# Patient Record
Sex: Female | Born: 1970 | Race: Black or African American | Hispanic: No | Smoking: Never smoker
Health system: Southern US, Community
[De-identification: ages and names within clinical notes are randomized; demographics above are authoritative.]

## PROBLEM LIST (undated history)

## (undated) ENCOUNTER — Encounter

## (undated) ENCOUNTER — Telehealth

## (undated) ENCOUNTER — Inpatient Hospital Stay: Payer: Medicare (Managed Care)

## (undated) ENCOUNTER — Ambulatory Visit

## (undated) ENCOUNTER — Ambulatory Visit: Attending: Internal Medicine | Primary: Internal Medicine

## (undated) ENCOUNTER — Ambulatory Visit: Payer: Medicare (Managed Care)

## (undated) ENCOUNTER — Encounter: Attending: Pharmacist | Primary: Pharmacist

## (undated) ENCOUNTER — Ambulatory Visit: Payer: MEDICARE | Attending: Neurology | Primary: Neurology

## (undated) ENCOUNTER — Ambulatory Visit: Payer: MEDICARE | Attending: Internal Medicine | Primary: Internal Medicine

## (undated) ENCOUNTER — Encounter
Attending: Student in an Organized Health Care Education/Training Program | Primary: Student in an Organized Health Care Education/Training Program

## (undated) ENCOUNTER — Ambulatory Visit: Payer: MEDICARE

## (undated) ENCOUNTER — Ambulatory Visit: Attending: Pharmacist | Primary: Pharmacist

## (undated) ENCOUNTER — Ambulatory Visit
Payer: MEDICARE | Attending: Student in an Organized Health Care Education/Training Program | Primary: Student in an Organized Health Care Education/Training Program

## (undated) ENCOUNTER — Ambulatory Visit: Attending: Neurology | Primary: Neurology

## (undated) ENCOUNTER — Ambulatory Visit: Payer: Medicare (Managed Care) | Attending: Foot & Ankle Surgery | Primary: Foot & Ankle Surgery

## (undated) DIAGNOSIS — I10 Essential (primary) hypertension: Secondary | ICD-10-CM

---
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
## 2017-07-01 ENCOUNTER — Ambulatory Visit
Admission: RE | Admit: 2017-07-01 | Discharge: 2017-07-01 | Disposition: A | Discharge status: Home / Self Care | Payer: MEDICARE

## 2017-07-01 DIAGNOSIS — I10 Essential (primary) hypertension: Secondary | ICD-10-CM

## 2017-07-01 DIAGNOSIS — E119 Type 2 diabetes mellitus without complications: Secondary | ICD-10-CM

## 2017-07-01 DIAGNOSIS — Z01818 Encounter for other preprocedural examination: Principal | ICD-10-CM

## 2017-07-01 DIAGNOSIS — F3130 Bipolar disorder, current episode depressed, mild or moderate severity, unspecified: Secondary | ICD-10-CM

## 2017-07-01 DIAGNOSIS — R102 Pelvic and perineal pain: Secondary | ICD-10-CM

## 2017-07-01 DIAGNOSIS — R569 Unspecified convulsions: Secondary | ICD-10-CM

## 2019-03-21 DIAGNOSIS — R4182 Altered mental status, unspecified: Principal | ICD-10-CM

## 2019-03-22 ENCOUNTER — Ambulatory Visit: Admit: 2019-03-22 | Discharge: 2019-03-22 | Disposition: A | Discharge status: Home / Self Care | Payer: MEDICARE

## 2020-07-08 ENCOUNTER — Other Ambulatory Visit: Payer: Self-pay

## 2020-07-08 ENCOUNTER — Encounter (HOSPITAL_COMMUNITY): Payer: Self-pay

## 2020-07-08 ENCOUNTER — Emergency Department (HOSPITAL_COMMUNITY)
Admission: EM | Admit: 2020-07-08 | Discharge: 2020-07-08 | Disposition: A | Discharge status: Home / Self Care | Payer: 59 | Attending: Emergency Medicine | Admitting: Emergency Medicine

## 2020-07-08 ENCOUNTER — Emergency Department (HOSPITAL_COMMUNITY): Payer: 59

## 2020-07-08 DIAGNOSIS — Z23 Encounter for immunization: Secondary | ICD-10-CM | POA: Insufficient documentation

## 2020-07-08 DIAGNOSIS — I10 Essential (primary) hypertension: Secondary | ICD-10-CM | POA: Diagnosis not present

## 2020-07-08 DIAGNOSIS — Y999 Unspecified external cause status: Secondary | ICD-10-CM | POA: Diagnosis not present

## 2020-07-08 DIAGNOSIS — W01198A Fall on same level from slipping, tripping and stumbling with subsequent striking against other object, initial encounter: Secondary | ICD-10-CM | POA: Insufficient documentation

## 2020-07-08 DIAGNOSIS — Y939 Activity, unspecified: Secondary | ICD-10-CM | POA: Diagnosis not present

## 2020-07-08 DIAGNOSIS — M25551 Pain in right hip: Secondary | ICD-10-CM

## 2020-07-08 DIAGNOSIS — S0003XA Contusion of scalp, initial encounter: Secondary | ICD-10-CM

## 2020-07-08 DIAGNOSIS — Y92831 Amusement park as the place of occurrence of the external cause: Secondary | ICD-10-CM | POA: Diagnosis not present

## 2020-07-08 DIAGNOSIS — S0101XA Laceration without foreign body of scalp, initial encounter: Secondary | ICD-10-CM | POA: Diagnosis not present

## 2020-07-08 DIAGNOSIS — S0990XA Unspecified injury of head, initial encounter: Secondary | ICD-10-CM | POA: Diagnosis present

## 2020-07-08 HISTORY — DX: Essential (primary) hypertension: I10

## 2020-07-08 MED ORDER — OXYCODONE-ACETAMINOPHEN 5-325 MG PO TABS
1.0000 | ORAL_TABLET | Freq: Once | ORAL | Status: AC
  Administered 2020-07-08: 1 via ORAL
  Filled 2020-07-08: qty 1

## 2020-07-08 MED ORDER — IBUPROFEN 600 MG PO TABS: 600.0000 mg | ORAL_TABLET | Freq: Four times a day (QID) | ORAL | 0 refills | Status: AC | PRN

## 2020-07-08 MED ORDER — TETANUS-DIPHTH-ACELL PERTUSSIS 5-2.5-18.5 LF-MCG/0.5 IM SUSP
0.5000 mL | Freq: Once | INTRAMUSCULAR | Status: AC
  Administered 2020-07-08: 0.5 mL via INTRAMUSCULAR
  Filled 2020-07-08: qty 0.5

## 2020-07-08 NOTE — ED Triage Notes (Addendum)
Pt [place] ED via EMS [place]. Pt reports headache 10/10, neck pain (refused c-collar), and right hip pain. Small laceration to back of head- bleeding controlled. Denies taking blood thinners, denies LOC
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
## 2020-07-08 NOTE — ED Notes (Signed)
Pt upset because head has not been scanned due to the head laceration being her main concern. Triage RN notified.

## 2020-07-08 NOTE — Discharge Instructions (Signed)
Take the prescribed medication as directed.  You can alternate with tylenol and try ice to help reduce any swelling. Follow-up with your primary care doctor. Return to the ED for new or worsening symptoms.

## 2020-07-08 NOTE — ED Notes (Signed)
Dermabond at bedside.  

## 2020-11-02 IMAGING — CR DG HIP (WITH OR WITHOUT PELVIS) 2-3V*R*
3 series · 3 of 3 positions shown · non-contrast
Comparison: None.

CLINICAL DATA: Right hip pain.  Fall.

EXAM:
DG HIP (WITH OR WITHOUT PELVIS) 2-3V RIGHT

[pelvis ap]
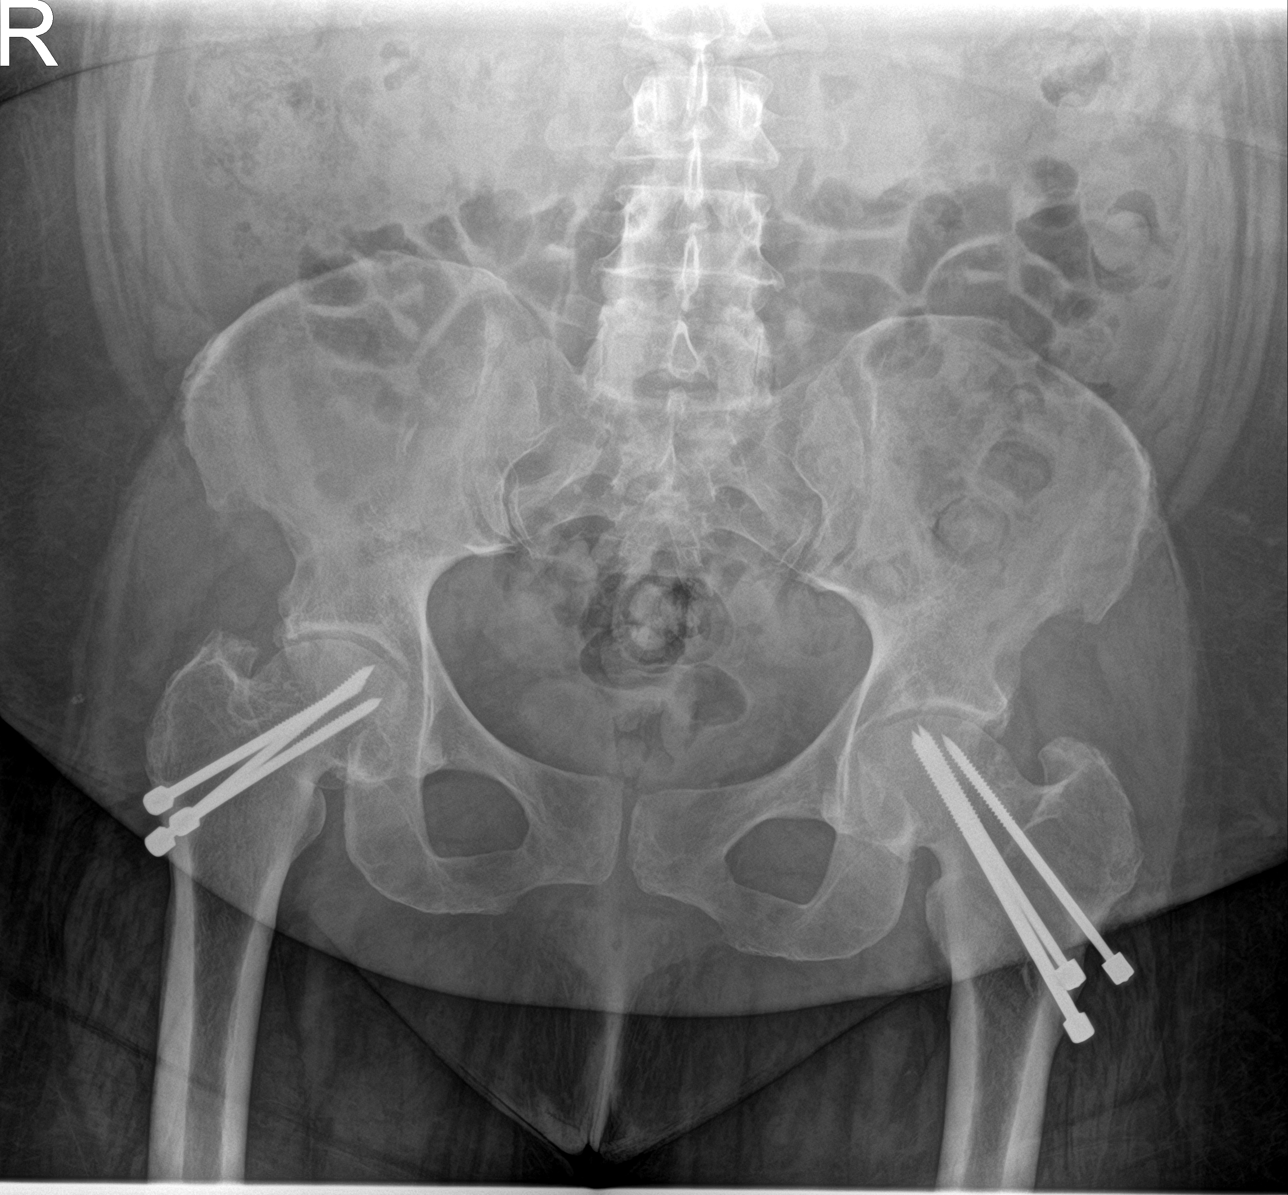

[hip ap]
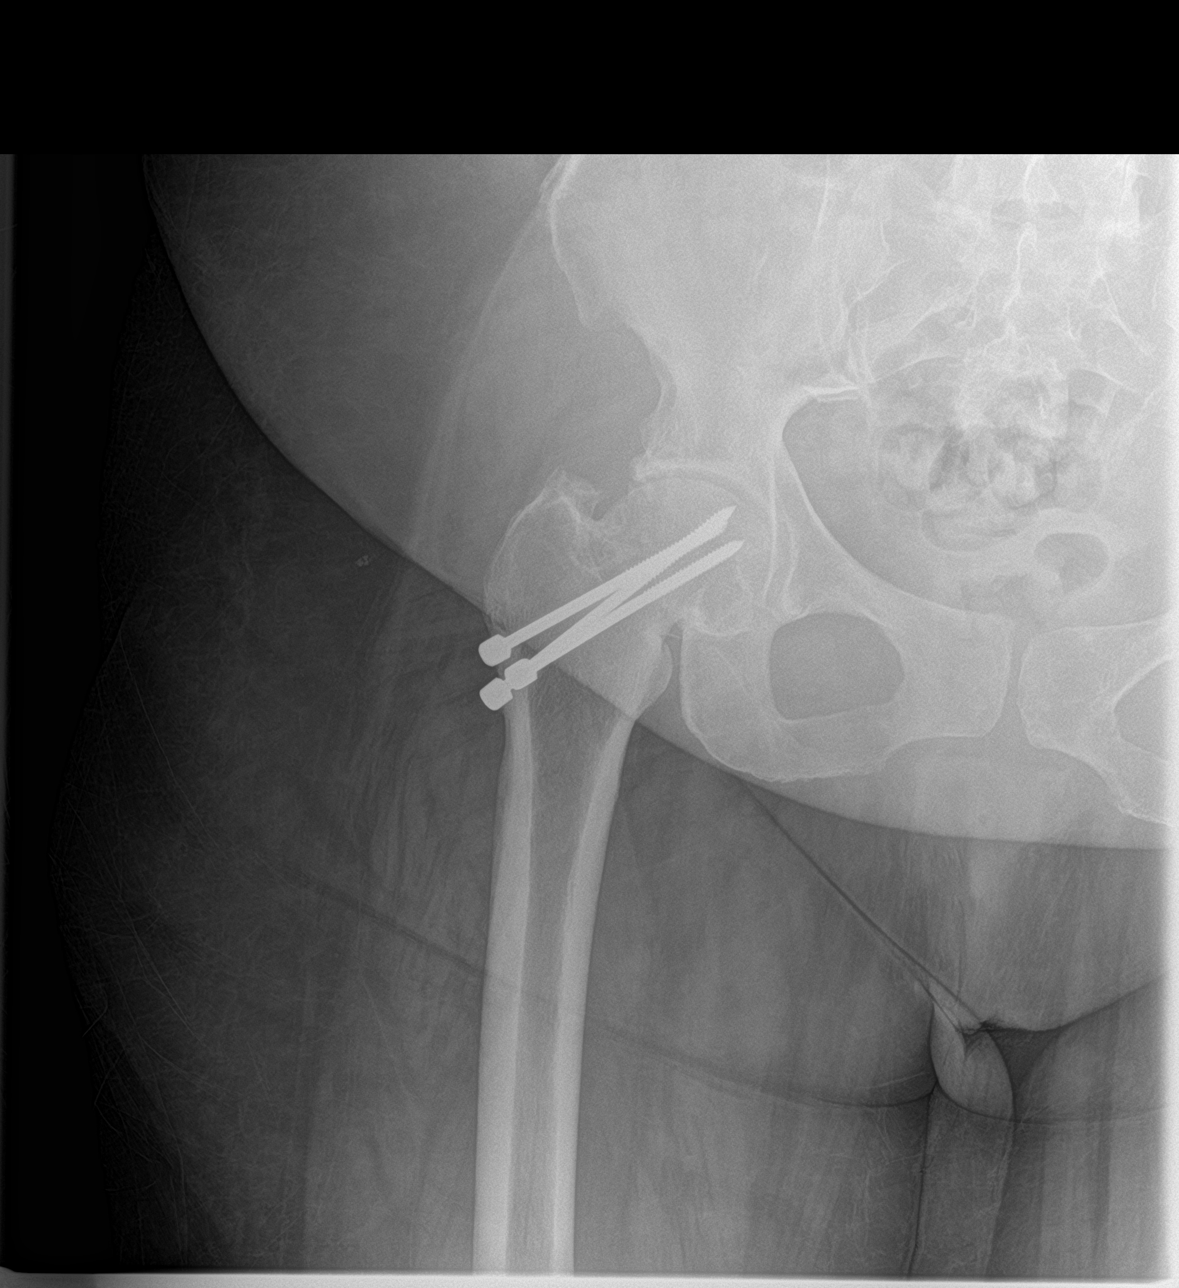

[hip lat]
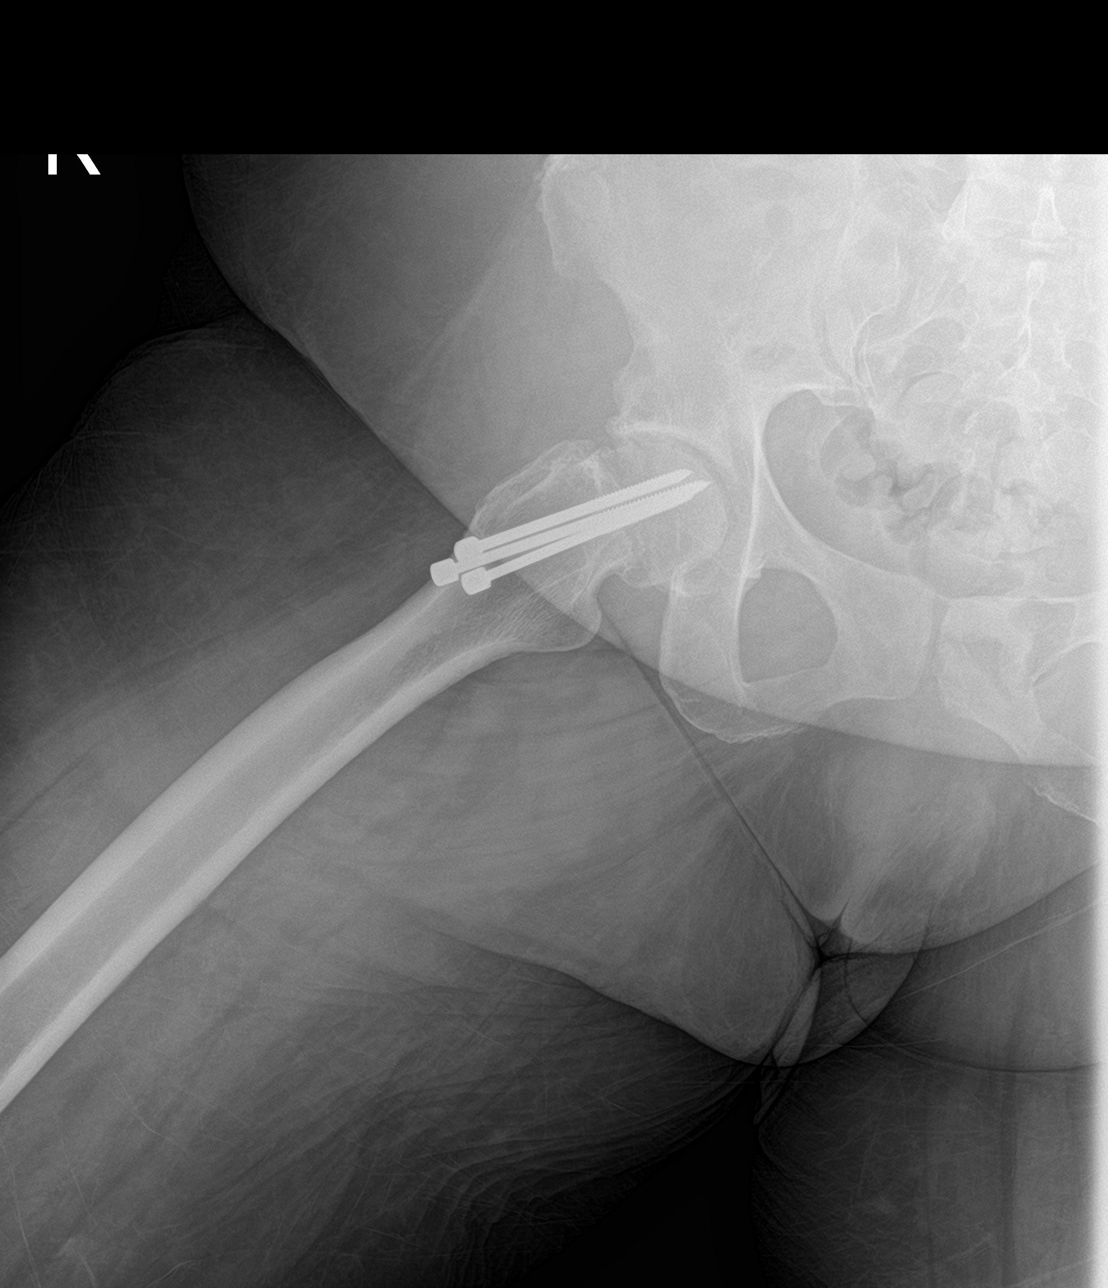

[3 of 3 positions shown; findings below may reference images not displayed]

FINDINGS: Pins seen within the proximal femurs bilaterally. Mild symmetric
degenerative changes in the hips with joint space narrowing and
spurring. No acute bony abnormality. Specifically, no fracture,
subluxation, or dislocation.
IMPRESSION: No acute bony abnormality.

## 2022-08-25 DIAGNOSIS — G40309 Generalized idiopathic epilepsy and epileptic syndromes, not intractable, without status epilepticus: Principal | ICD-10-CM

## 2022-08-29 ENCOUNTER — Ambulatory Visit
Admit: 2022-08-29 | Discharge: 2022-08-29 | Disposition: A | Discharge status: Home / Self Care | Payer: MEDICARE | Attending: Emergency Medicine

## 2022-09-12 ENCOUNTER — Ambulatory Visit: Admit: 2022-09-12 | Discharge: 2022-09-13 | Payer: MEDICARE

## 2022-11-20 MED ORDER — DIVALPROEX ER 250 MG TABLET,EXTENDED RELEASE 24 HR
ORAL_TABLET | Freq: Two times a day (BID) | ORAL | 0 refills | 30 days | Status: CP
Start: 2022-11-20 — End: ?

## 2022-12-03 ENCOUNTER — Ambulatory Visit
Admit: 2022-12-03 | Discharge: 2022-12-04 | Payer: MEDICARE | Attending: Student in an Organized Health Care Education/Training Program | Primary: Student in an Organized Health Care Education/Training Program

## 2022-12-03 DIAGNOSIS — M2141 Flat foot [pes planus] (acquired), right foot: Principal | ICD-10-CM

## 2022-12-03 DIAGNOSIS — M2142 Flat foot [pes planus] (acquired), left foot: Principal | ICD-10-CM

## 2022-12-03 DIAGNOSIS — M2021 Hallux rigidus, right foot: Principal | ICD-10-CM

## 2022-12-03 DIAGNOSIS — M79672 Pain in left foot: Principal | ICD-10-CM

## 2022-12-03 DIAGNOSIS — L84 Corns and callosities: Principal | ICD-10-CM

## 2022-12-03 DIAGNOSIS — M79671 Pain in right foot: Principal | ICD-10-CM

## 2022-12-03 DIAGNOSIS — B351 Tinea unguium: Principal | ICD-10-CM

## 2022-12-03 DIAGNOSIS — M2022 Hallux rigidus, left foot: Principal | ICD-10-CM

## 2022-12-03 DIAGNOSIS — E1142 Type 2 diabetes mellitus with diabetic polyneuropathy: Principal | ICD-10-CM

## 2022-12-03 MED ORDER — CAPSAICIN 0.1 % TOPICAL CREAM
Freq: Three times a day (TID) | TOPICAL | 0 refills | 0 days | Status: CP
Start: 2022-12-03 — End: ?

## 2022-12-04 ENCOUNTER — Ambulatory Visit: Admit: 2022-12-04 | Discharge: 2022-12-05 | Payer: MEDICARE

## 2022-12-04 DIAGNOSIS — R569 Unspecified convulsions: Principal | ICD-10-CM

## 2022-12-04 MED ORDER — TRUE METRIX GLUCOSE TEST STRIP
ORAL_STRIP | Freq: Two times a day (BID) | 5 refills | 0 days | Status: CP
Start: 2022-12-04 — End: ?

## 2022-12-04 MED ORDER — OMEPRAZOLE 20 MG CAPSULE,DELAYED RELEASE
ORAL_CAPSULE | Freq: Every day | ORAL | 0 refills | 30 days | Status: CP
Start: 2022-12-04 — End: 2023-12-04

## 2022-12-04 MED ORDER — DIVALPROEX ER 250 MG TABLET,EXTENDED RELEASE 24 HR
ORAL_TABLET | Freq: Two times a day (BID) | ORAL | 0 refills | 30 days | Status: CP
Start: 2022-12-04 — End: ?

## 2022-12-11 ENCOUNTER — Ambulatory Visit: Admit: 2022-12-11 | Discharge: 2022-12-12 | Payer: MEDICARE | Attending: Internal Medicine | Primary: Internal Medicine

## 2022-12-11 DIAGNOSIS — R079 Chest pain, unspecified: Principal | ICD-10-CM

## 2022-12-11 DIAGNOSIS — R011 Cardiac murmur, unspecified: Principal | ICD-10-CM

## 2022-12-22 DIAGNOSIS — R569 Unspecified convulsions: Principal | ICD-10-CM

## 2022-12-22 MED ORDER — DIVALPROEX ER 250 MG TABLET,EXTENDED RELEASE 24 HR
ORAL_TABLET | Freq: Two times a day (BID) | ORAL | 1 refills | 90 days | Status: CP
Start: 2022-12-22 — End: ?

## 2022-12-26 ENCOUNTER — Ambulatory Visit: Admit: 2022-12-26 | Discharge: 2022-12-29 | Disposition: A | Discharge status: Home / Self Care | Payer: MEDICARE

## 2022-12-26 ENCOUNTER — Ambulatory Visit: Admit: 2022-12-26 | Discharge: 2022-12-29 | Payer: MEDICARE

## 2022-12-28 MED ORDER — LACOSAMIDE 150 MG TABLET
ORAL_TABLET | Freq: Two times a day (BID) | ORAL | 0 refills | 30 days | Status: CP
Start: 2022-12-28 — End: 2023-01-27
  Filled 2022-12-29: qty 60, 30d supply, fill #0

## 2022-12-29 MED ORDER — OMEPRAZOLE 20 MG CAPSULE,DELAYED RELEASE
ORAL_CAPSULE | Freq: Every day | ORAL | 1 refills | 90 days | Status: CP
Start: 2022-12-29 — End: ?

## 2023-01-29 ENCOUNTER — Ambulatory Visit: Admit: 2023-01-29 | Discharge: 2023-01-30 | Payer: MEDICARE

## 2023-01-29 DIAGNOSIS — R569 Unspecified convulsions: Principal | ICD-10-CM

## 2023-01-29 MED ORDER — CANNABIDIOL 100 MG/ML ORAL SOLUTION
5 refills | 0 days | Status: CP
Start: 2023-01-29 — End: ?
  Filled 2023-02-05: qty 240, 30d supply, fill #0

## 2023-02-05 ENCOUNTER — Ambulatory Visit: Admit: 2023-02-05 | Discharge: 2023-02-05 | Payer: MEDICARE

## 2023-03-25 DIAGNOSIS — R011 Cardiac murmur, unspecified: Principal | ICD-10-CM

## 2023-03-25 DIAGNOSIS — R079 Chest pain, unspecified: Principal | ICD-10-CM

## 2023-04-07 ENCOUNTER — Telehealth: Admit: 2023-04-07 | Discharge: 2023-04-08 | Payer: MEDICARE

## 2023-04-07 DIAGNOSIS — R569 Unspecified convulsions: Principal | ICD-10-CM

## 2023-04-07 MED ORDER — LACOSAMIDE 50 MG TABLET
ORAL_TABLET | 1 refills | 0 days | Status: CP
Start: 2023-04-07 — End: ?

## 2023-04-14 DIAGNOSIS — R200 Anesthesia of skin: Principal | ICD-10-CM

## 2023-04-28 DIAGNOSIS — R569 Unspecified convulsions: Principal | ICD-10-CM

## 2023-04-28 MED ORDER — LAMOTRIGINE 25 MG TABLET
ORAL_TABLET | ORAL | 2 refills | 0.00000 days | Status: CP
Start: 2023-04-28 — End: ?

## 2023-05-19 DIAGNOSIS — R569 Unspecified convulsions: Principal | ICD-10-CM

## 2023-05-19 MED ORDER — DIVALPROEX ER 250 MG TABLET,EXTENDED RELEASE 24 HR
ORAL_TABLET | Freq: Two times a day (BID) | ORAL | 2 refills | 90 days | Status: CP
Start: 2023-05-19 — End: ?

## 2023-05-20 DIAGNOSIS — R569 Unspecified convulsions: Principal | ICD-10-CM

## 2023-05-20 MED ORDER — LAMOTRIGINE 25 MG TABLET
ORAL_TABLET | ORAL | 2 refills | 0.00000 days | Status: CP
Start: 2023-05-20 — End: ?

## 2023-05-25 DIAGNOSIS — R569 Unspecified convulsions: Principal | ICD-10-CM

## 2023-05-25 MED ORDER — DIVALPROEX ER 250 MG TABLET,EXTENDED RELEASE 24 HR
ORAL_TABLET | Freq: Two times a day (BID) | ORAL | 4 refills | 90.00000 days | Status: CP
Start: 2023-05-25 — End: 2023-05-25

## 2023-05-26 ENCOUNTER — Ambulatory Visit: Admit: 2023-05-26 | Discharge: 2023-05-27 | Payer: MEDICARE

## 2023-06-08 MED ORDER — CANNABIDIOL 100 MG/ML ORAL SOLUTION
5 refills | 0 days | Status: CP
Start: 2023-06-08 — End: ?
  Filled 2023-06-12: qty 240, 30d supply, fill #0
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
## 2023-06-10 NOTE — Unmapped (Signed)
[facility] Specialty Medication Onboarding    Specialty Medication: Epidiolex  Prior Authorization: Not Required   Financial Assistance: No - copay  <$25  Final Copay/Day Supply: $0 / 30    Insurance Restrictions: None     Notes to Pharmacist: None  Credit Card on File: not applicable    The triage team has completed the benefits investigation and has determined that the patient is able to fill this medication at [facility]. Please contact the patient to complete the onboarding or follow up with the prescribing physician as needed.
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
## 2023-06-17 NOTE — Unmapped (Signed)
Patient reports that she had a seizure yesterday.  Pt says she had not missed any seizure medications.  Pt is asking to speak with provider about RNS.    Pt states I can't keep going to the hospital, when they don't know what to do about it and it's too expensive.  Pt states that  she is on disability to care for her son.    Pt asking for call back.

## 2023-06-18 NOTE — Unmapped (Signed)
As I told patient, I reached out to pt's PCP to ask about any interactions with the medications that pt is taking.    I left this question with a person named Debbe Odea and she said she would get the message to Dr Carrick Rijos Maki.  I also left my direct number for call back.    I also messaged our clinic pharmacist to advise as well.

## 2023-06-18 NOTE — Unmapped (Signed)
I returned pt's call from yesterday, after hearing back from Donnetta Hail, FNP.  I explained to patient that pt is on a very low dose and we will have to continue increasing the dose and see if pt can tolerate the medication and if higher dose can help with seizure.      I explained that RNS requires a long discussion in clinic, because there is A LOT of testing involve including being in the hospital in the epilepsy monitoring unit for 7-10 days 2 times before RNS can be implanted.    I told pt that this can be discussed at pt's visit on 7/30 at 2:30pm.     Pt states Am I really going to have to wait that long? I'm being found in the bathtub after having seizure. I want to speak to my doctor - you mean she doesn't have 5 minutes to call me back?  I explained that Ms Loma Newton has full clinic this week and patient has an upcoming appointment.  We discussed seizure safety and not bathing alone or at all, if likely to have seizures. We discussed the possibility of putting a plastic chair in the shower.    Pt states that there is no water in the bathtub- pt says she just sits there when she's having a seizure.    Pt is asking if she can see another neurologist in [place] or at [place].  I explained that we can send a referral to the clinic of pt's choice, if pt is dissatisfied with the care here.  Pt's tone of voice was sarcastic, and frankly rude, during our discussion.    Pt expressed concern about the possible interaction of the meds that pt is taking.  I offered to contact pt's PCP and discuss the meds that pt takes, as well as consult the clinic's pharmacist.  Pt wished me a good day and abruptly hung up the phone.    After the call was ended - I realized that Ms Loma Newton had actually spoken to pt by phone last week.

## 2023-07-02 NOTE — Unmapped (Signed)
[facility] Clinical Assessment & Refill Coordination Note    Stacy Macias, [place]: 04-01-1971  Phone: [phone] (home)     All above HIPAA information was verified with patient.     Was a Nurse, learning disability used for this call? No    Specialty Medication(s):   Neurology: Epidiolex     Current Outpatient Medications   Medication Sig Dispense Refill    alendronate sodium/vitamin D3 (ALENDRONATE-VITAMIN D3 ORAL) Take 10 mg by mouth in the morning. (Patient not taking: Reported on 06/11/2023)      atorvastatin (LIPITOR) 10 MG tablet Take 1 tablet (10 mg total) by mouth nightly.      blood sugar diagnostic (TRUE METRIX GLUCOSE TEST STRIP) Strp by Other route two (2) times a day. 60 strip 5    blood sugar diagnostic Strp 1 each.      cannabidiol (EPIDIOLEX) 100 mg/mL Soln oral solution Take 1 ml (100mg ) by mouth twice daily for 7 days, then take 2ml (200mg ) by mouth twice daily for 7 days, then take 3ml (300mg ) by mouth twice daily for 7 days then take 4ml (400mg ) by mouth twice daily thereafter 240 mL 5    capsaicin 0.1 % topical cream Apply topically Three (3) times a day. 42.5 g 0    divalproex ER (DEPAKOTE ER) 250 MG extended release 24 hr tablet Take 3 tablets (750 mg total) by mouth two (2) times a day. 540 tablet 4    ergocalciferol-1,250 mcg, 50,000 unit, (DRISDOL) 1,250 mcg (50,000 unit) capsule Take 1 capsule (1,250 mcg total) by mouth Every Monday.      fish oil-omega-3 fatty acids 300-1,000 mg capsule Take 2 capsules (2 g total) by mouth daily.      multivitamin (THERAGRAN) per tablet Take 1 tablet by mouth daily.      valsartan (DIOVAN) 80 MG tablet Take 1 tablet (80 mg total) by mouth daily.       No current facility-administered medications for this visit.        Changes to medications: Yeni reports no changes at this time.    Allergies   Allergen Reactions    Lisinopril Swelling and Anaphylaxis    Metoprolol      Unknown reaction       Changes to allergies: No    SPECIALTY MEDICATION ADHERENCE     Epidiolex 100 mg/ml: ~10 days of medicine on hand       Medication Adherence    Patient reported X missed doses in the last month: 0  Specialty Medication: Epidiolex  Patient is on additional specialty medications: No  Informant: patient          Specialty medication(s) dose(s) confirmed: Regimen is correct and unchanged.     Are there any concerns with adherence? No    Adherence counseling provided? Not needed    CLINICAL MANAGEMENT AND INTERVENTION      Clinical Benefit Assessment:    Do you feel the medicine is effective or helping your condition?  Unable to determine    Clinical Benefit counseling provided? Not needed    Adverse Effects Assessment:    Are you experiencing any side effects? No    Are you experiencing difficulty administering your medicine? No    Quality of Life Assessment:    Quality of Life    Rheumatology  Oncology  Dermatology  Cystic Fibrosis          How many days over the past month did your seizures  keep you from your normal activities? For example, brushing your teeth or getting up in the morning. 0    Have you discussed this with your provider? Not needed    Acute Infection Status:    Acute infections noted within Epic:  No active infections  Patient reported infection: None    Therapy Appropriateness:    Is therapy appropriate and patient progressing towards therapeutic goals? Yes, therapy is appropriate and should be continued    DISEASE/MEDICATION-SPECIFIC INFORMATION      N/A    Other Neurological Condtions: Not Applicable    PATIENT SPECIFIC NEEDS     Does the patient have any physical, cognitive, or cultural barriers? No    Is the patient high risk? No    Did the patient require a clinical intervention? No    Does the patient require physician intervention or other additional services (i.e., nutrition, smoking cessation, social work)? No    SOCIAL DETERMINANTS OF HEALTH     At the [facility], we have learned that life circumstances - like trouble affording food, housing, utilities, or transportation can affect the health of many of our patients.   That is why we wanted to ask: are you currently experiencing any life circumstances that are negatively impacting your health and/or quality of life? Patient declined to answer    Social Determinants of Health     Financial Resource Strain: Low Risk  (12/27/2022)    Overall Financial Resource Strain (CARDIA)     Difficulty of Paying Living Expenses: Not hard at all   Internet Connectivity: No Internet connectivity concern identified (12/27/2022)    Internet Connectivity     Do you have access to internet services: Yes     How do you connect to the internet: Personal Device at home     Is your internet connection strong enough for you to watch video on your device without major problems?: Yes     Do you have enough data to get through the month?: Yes     Does at least one of the devices have a camera that you can use for video chat?: Yes   Food Insecurity: No Food Insecurity (12/27/2022)    Hunger Vital Sign     Worried About Running Out of Food in the Last Year: Never true     Ran Out of Food in the Last Year: Never true   Tobacco Use: Medium Risk (04/29/2023)    Received from [facility]    Patient History     Smoking Tobacco Use: Former     Smokeless Tobacco Use: Never     Passive Exposure: Not on file   Housing/Utilities: Low Risk  (12/27/2022)    Housing/Utilities     Within the past 12 months, have you ever stayed: outside, in a car, in a tent, in an overnight shelter, or temporarily in someone else's home (i.e. couch-surfing)?: No     Are you worried about losing your housing?: No     Within the past 12 months, have you been unable to get utilities (heat, electricity) when it was really needed?: No   Alcohol Use: Not At Risk (12/27/2022)    Alcohol Use     How often do you have a drink containing alcohol?: Never     How many drinks containing alcohol do you have on a typical day when you are drinking?: Not on file     How often do you have 5 or more  drinks on one occasion?: Never   Transportation Needs: No Transportation Needs (12/27/2022)    PRAPARE - Transportation     Lack of Transportation (Medical): No     Lack of Transportation (Non-Medical): No   Substance Use: High Risk (12/27/2022)    Substance Use     Taken prescription drugs for non-medical reasons: Not on file     Taken illegal drugs: Weekly     Patient indicated they have taken drugs in the past year for non-medical reasons: Yes, [positive answer(s)]: Yes   Health Literacy: Low Risk  (12/27/2022)    Health Literacy     : Never   Physical Activity: Sufficiently Active (12/27/2022)    Exercise Vital Sign     Days of Exercise per Week: 5 days     Minutes of Exercise per Session: 60 min   Interpersonal Safety: Not At Risk (12/27/2022)    Interpersonal Safety     Unsafe Where You Currently Live: No     Physically Hurt by Anyone: No     Abused by Anyone: No   Stress: Stress Concern Present (12/27/2022)    [employer] of Occupational Health - Occupational Stress Questionnaire     Feeling of Stress : To some extent   Intimate Partner Violence: Not At Risk (12/27/2022)    Humiliation, Afraid, Rape, and Kick questionnaire     Fear of Current or Ex-Partner: No     Emotionally Abused: No     Physically Abused: No     Sexually Abused: No   Depression: Not at risk (12/27/2022)    PHQ-2     PHQ-2 Score: 0   Social Connections: Moderately Isolated (12/27/2022)    Social Connection and Isolation Panel [NHANES]     Frequency of Communication with Friends and Family: More than three times a week     Frequency of Social Gatherings with Friends and Family: More than three times a week     Attends Religious Services: Never     Database administrator or Organizations: No     Attends Banker Meetings: Never     Marital Status: Married       Would you be willing to receive help with any of the needs that you have identified today? Not applicable SHIPPING     Specialty Medication(s) to be Shipped:   Neurology: Epidiolex    Other medication(s) to be shipped: No additional medications requested for fill at this time     Changes to insurance: No    Delivery Scheduled: Yes, Expected medication delivery date: 07/09/23.     Medication will be delivered via Next Day Courier to the confirmed prescription address in [facility].    The patient will receive a drug information handout for each medication shipped and additional FDA Medication Guides as required.  Verified that patient has previously received a Conservation officer, historic buildings and a Surveyor, mining.    The patient or caregiver noted above participated in the development of this care plan and knows that they can request review of or adjustments to the care plan at any time.      All of the patient's questions and concerns have been addressed.    Arnold Long, PharmD   [facility] Specialty Pharmacist

## 2023-07-08 MED FILL — EPIDIOLEX 100 MG/ML ORAL SOLUTION: 30 days supply | Qty: 240 | Fill #1

## 2023-07-09 NOTE — Unmapped (Signed)
Pt declines appointment . She established with [facility] due to transportation issues getting to [place] and wanted more local provider.

## 2023-07-10 NOTE — Unmapped (Signed)
Stacy Macias will still see patient if she asks to schedule. She had said she transferred care.   Staff message from 07/09/23:Ngo, Nelma Rothman, FNP  Walker Kehr; Mercy Riding, Ruffin Frederick, RN; Ellie Lunch  Alright.  When  she calls back, I will take her still.  Thanks  [employer]

## 2023-07-10 NOTE — Unmapped (Signed)
New pt referral received. Need to check care everywhere for records.

## 2023-07-31 NOTE — Unmapped (Signed)
Stacy Macias has been contacted in regards to their refill of cannabidiol: EPIDIOLEX 100 mg/mL Soln oral solution. At this time, they have declined refill due to  states  she  has  enough  right now   . Refill assessment call date has been updated per the patient's request.

## 2023-08-03 DIAGNOSIS — R569 Unspecified convulsions: Principal | ICD-10-CM

## 2023-08-03 MED ORDER — LAMOTRIGINE 25 MG TABLET
ORAL_TABLET | 0 refills | 0 days
Start: 2023-08-03 — End: ?

## 2023-08-04 NOTE — Unmapped (Signed)
Refill request received from patient.      Medication Requested: Lamotrigine   Last Office Visit: 04/07/2023   Next Office Visit: Visit date not found  Last Prescriber: Donnetta Hail    Nurse refill requirements met? No  If not met, why: Medication not on active med list    Sent to: Provider for signing  If sent to provider, which provider?: Donnetta Hail

## 2023-08-05 MED ORDER — LAMOTRIGINE 25 MG TABLET
ORAL_TABLET | 0 refills | 0 days
Start: 2023-08-05 — End: ?

## 2023-08-05 NOTE — Unmapped (Signed)
Patient is no longer receiving care at [facility] neurology.

## 2023-08-29 NOTE — Unmapped (Signed)
Patient was notified of operational disruptions. Patient opted to: schedule their refill with understanding of potential delay until 10/1 or later. This was facilitated by pharmacy staff     [facility] Refill Coordination Note    Specialty Medication(s) to be Shipped:   Neurology: Epidiolex    Other medication(s) to be shipped: No additional medications requested for fill at this time     [facility], DOB: Sep 27, 1971  Phone: [phone] (home)       All above HIPAA information was verified with patient.     Was a Nurse, learning disability used for this call? No    Completed refill call assessment today to schedule patient's medication shipment from the [facility]  [phone]).  All relevant notes have been reviewed.     Specialty medication(s) and dose(s) confirmed: Regimen is correct and unchanged.   Changes to medications: Stacy Macias reports no changes at this time.  Changes to insurance: No  New side effects reported not previously addressed with a pharmacist or physician: None reported  Questions for the pharmacist: No    Confirmed patient received a Conservation officer, historic buildings and a Surveyor, mining with first shipment. The patient will receive a drug information handout for each medication shipped and additional FDA Medication Guides as required.       DISEASE/MEDICATION-SPECIFIC INFORMATION        N/A    SPECIALTY MEDICATION ADHERENCE     Medication Adherence    Patient reported X missed doses in the last month: 0  Specialty Medication: EPIDIOLEX 100 mg/mL  Patient is on additional specialty medications: No  Patient is on more than two specialty medications: No  Any gaps in refill history greater than 2 weeks in the last 3 months: no  Demonstrates understanding of importance of adherence: yes              Were doses missed due to medication being on hold? No   Epidiolex 100mg /ml   : 2  days of medicine on hand        REFERRAL TO PHARMACIST     Referral to the pharmacist: Not needed      [facility]     Shipping address confirmed in Epic.       Delivery Scheduled: Yes, Expected medication delivery date: 08/31/23.     Medication will be delivered via Same Day Courier to the prescription address in Epic WAM.    Stacy Macias   [facility]  Specialty Technician

## 2023-08-31 MED FILL — EPIDIOLEX 100 MG/ML ORAL SOLUTION: 30 days supply | Qty: 240 | Fill #2

## 2023-09-30 NOTE — Unmapped (Signed)
The [facility] has made a second and final attempt to reach this patient to refill the following medication: EPIDIOLEX 100 mg/mL Soln oral solution (cannabidiol)    We have been unable to leave messages on the following phone numbers: [phone], have sent a text message to the following phone numbers: [phone], and have sent a Mychart questionnaire..    Dates contacted: 09/20/23 and  1030/24   Last scheduled delivery: 08/31/23    The patient may be at risk of non-compliance with this medication. The patient should call the [facility] at [phone]  Option 4, then Option 3: Allergy, Immunology, Pulmonary, Neurology to refill medication.    Ricci Barker   [facility]

## 2023-10-08 NOTE — Unmapped (Signed)
RN called patient's son Phylliss Bob, and patient was with him.  RN provided the number to [facility] and requested that she call them to get her epidiolex refilled. She wrote down the number for [facility].

## 2023-12-24 NOTE — Unmapped (Signed)
Specialty Medication(s): Epidiolex    Stacy Macias has been dis-enrolled from the [employer] [facility] specialty pharmacy services due to multiple unsuccessful outreach attempts by the pharmacy.    Additional information provided to the patient: n/a    Arnold Long, PharmD  [facility] Specialty Pharmacist

## 2023-12-31 ENCOUNTER — Ambulatory Visit: Admit: 2023-12-31 | Discharge: 2024-01-01 | Payer: MEDICARE | Attending: Internal Medicine | Primary: Internal Medicine

## 2023-12-31 DIAGNOSIS — E785 Hyperlipidemia, unspecified: Principal | ICD-10-CM

## 2023-12-31 MED ORDER — VALSARTAN 320 MG TABLET
ORAL_TABLET | Freq: Every day | ORAL | 6 refills | 30.00 days | Status: CP
Start: 2023-12-31 — End: 2024-12-30

## 2024-03-03 ENCOUNTER — Ambulatory Visit: Admit: 2024-03-03 | Discharge: 2024-03-04 | Attending: Internal Medicine | Primary: Internal Medicine

## 2024-03-03 DIAGNOSIS — I209 Angina pectoris, unspecified: Principal | ICD-10-CM

## 2024-03-03 MED ORDER — HYDRALAZINE 25 MG TABLET
ORAL_TABLET | Freq: Three times a day (TID) | ORAL | 6 refills | 30 days | Status: CP
Start: 2024-03-03 — End: 2024-03-03

## 2024-03-04 MED ORDER — AMLODIPINE 5 MG TABLET
ORAL_TABLET | Freq: Every day | ORAL | 3 refills | 90 days | Status: CP
Start: 2024-03-04 — End: ?

## 2024-03-10 DIAGNOSIS — G40109 Localization-related (focal) (partial) symptomatic epilepsy and epileptic syndromes with simple partial seizures, not intractable, without status epilepticus: Principal | ICD-10-CM

## 2024-05-25 DIAGNOSIS — R569 Unspecified convulsions: Principal | ICD-10-CM

## 2024-05-25 MED ORDER — DIVALPROEX ER 250 MG TABLET,EXTENDED RELEASE 24 HR
ORAL_TABLET | Freq: Two times a day (BID) | ORAL | 11 refills | 90.00000 days
Start: 2024-05-25 — End: ?

## 2024-05-26 DIAGNOSIS — R569 Unspecified convulsions: Principal | ICD-10-CM

## 2024-05-26 MED ORDER — DIVALPROEX ER 250 MG TABLET,EXTENDED RELEASE 24 HR
ORAL_TABLET | Freq: Two times a day (BID) | ORAL | 4 refills | 90.00000 days
Start: 2024-05-26 — End: ?

## 2024-10-21 MED ORDER — AMLODIPINE 5 MG TABLET
ORAL_TABLET | Freq: Every day | ORAL | 1 refills | 90.00000 days | Status: CP
Start: 2024-10-21 — End: ?

## 2024-12-05 DIAGNOSIS — E1140 Type 2 diabetes mellitus with diabetic neuropathy, unspecified: Principal | ICD-10-CM

## 2024-12-12 DIAGNOSIS — S0219XK Other fracture of base of skull, subsequent encounter for fracture with nonunion: Principal | ICD-10-CM
